# Patient Record
Sex: Female | Born: 2000 | Race: White | Hispanic: No | Marital: Single | Smoking: Never smoker
Health system: Southern US, Community
[De-identification: ages and names within clinical notes are randomized; demographics above are authoritative.]

---
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
## 2020-12-21 ENCOUNTER — Encounter (HOSPITAL_BASED_OUTPATIENT_CLINIC_OR_DEPARTMENT_OTHER): Payer: Self-pay | Admitting: Emergency Medicine

## 2020-12-21 ENCOUNTER — Other Ambulatory Visit: Payer: Self-pay

## 2020-12-21 ENCOUNTER — Emergency Department (HOSPITAL_BASED_OUTPATIENT_CLINIC_OR_DEPARTMENT_OTHER)
Admission: EM | Admit: 2020-12-21 | Discharge: 2020-12-22 | Disposition: A | Discharge status: Home / Self Care | Payer: BC Managed Care – PPO | Attending: Emergency Medicine | Admitting: Emergency Medicine

## 2020-12-21 DIAGNOSIS — R7401 Elevation of levels of liver transaminase levels: Secondary | ICD-10-CM | POA: Insufficient documentation

## 2020-12-21 DIAGNOSIS — R748 Abnormal levels of other serum enzymes: Secondary | ICD-10-CM

## 2020-12-21 DIAGNOSIS — R109 Unspecified abdominal pain: Secondary | ICD-10-CM | POA: Insufficient documentation

## 2020-12-21 DIAGNOSIS — R188 Other ascites: Secondary | ICD-10-CM | POA: Insufficient documentation

## 2020-12-21 DIAGNOSIS — K7689 Other specified diseases of liver: Secondary | ICD-10-CM

## 2020-12-21 NOTE — ED Triage Notes (Signed)
Patient arrived via POV c/o abdominal pain x 2 days. Patient states pain starting while in hospital for tonsillitis. Patient states abdominal pain in RUQ/LUQ. Patient states 5/10 pain. Patient is AO x 4, VS w/ elevated HR, normal gait.

## 2020-12-22 ENCOUNTER — Emergency Department (HOSPITAL_BASED_OUTPATIENT_CLINIC_OR_DEPARTMENT_OTHER): Payer: BC Managed Care – PPO

## 2020-12-22 LAB — CBC WITH DIFFERENTIAL/PLATELET
Abs Immature Granulocytes: 0.11 10*3/uL — ABNORMAL HIGH (ref 0.00–0.07)
Basophils Absolute: 0 10*3/uL (ref 0.0–0.1)
Basophils Relative: 0 %
Eosinophils Absolute: 0 10*3/uL (ref 0.0–0.5)
Eosinophils Relative: 0 %
HCT: 33 % — ABNORMAL LOW (ref 36.0–46.0)
Hemoglobin: 10.9 g/dL — ABNORMAL LOW (ref 12.0–15.0)
Immature Granulocytes: 1 %
Lymphocytes Relative: 25 %
Lymphs Abs: 4.3 10*3/uL — ABNORMAL HIGH (ref 0.7–4.0)
MCH: 27.2 pg (ref 26.0–34.0)
MCHC: 33 g/dL (ref 30.0–36.0)
MCV: 82.3 fL (ref 80.0–100.0)
Monocytes Absolute: 1.8 10*3/uL — ABNORMAL HIGH (ref 0.1–1.0)
Monocytes Relative: 10 %
Neutro Abs: 11.1 10*3/uL — ABNORMAL HIGH (ref 1.7–7.7)
Neutrophils Relative %: 64 %
Platelets: 363 10*3/uL (ref 150–400)
RBC: 4.01 MIL/uL (ref 3.87–5.11)
RDW: 16.2 % — ABNORMAL HIGH (ref 11.5–15.5)
WBC: 17.4 10*3/uL — ABNORMAL HIGH (ref 4.0–10.5)
nRBC: 0 % (ref 0.0–0.2)

## 2020-12-22 LAB — COMPREHENSIVE METABOLIC PANEL
ALT: 116 U/L — ABNORMAL HIGH (ref 0–44)
AST: 61 U/L — ABNORMAL HIGH (ref 15–41)
Albumin: 3 g/dL — ABNORMAL LOW (ref 3.5–5.0)
Alkaline Phosphatase: 85 U/L (ref 38–126)
Anion gap: 8 (ref 5–15)
BUN: 11 mg/dL (ref 6–20)
CO2: 24 mmol/L (ref 22–32)
Calcium: 7.9 mg/dL — ABNORMAL LOW (ref 8.9–10.3)
Chloride: 104 mmol/L (ref 98–111)
Creatinine, Ser: 0.62 mg/dL (ref 0.44–1.00)
GFR, Estimated: >60 mL/min (ref >60)
Glucose, Bld: 98 mg/dL (ref 70–99)
Potassium: 3.3 mmol/L — ABNORMAL LOW (ref 3.5–5.1)
Sodium: 136 mmol/L (ref 135–145)
Total Bilirubin: 0.4 mg/dL (ref 0.3–1.2)
Total Protein: 6.5 g/dL (ref 6.5–8.1)

## 2020-12-22 LAB — URINALYSIS, ROUTINE W REFLEX MICROSCOPIC
Bilirubin Urine: NEGATIVE
Glucose, UA: NEGATIVE mg/dL
Hgb urine dipstick: NEGATIVE
Ketones, ur: NEGATIVE mg/dL
Leukocytes,Ua: NEGATIVE
Nitrite: NEGATIVE
Protein, ur: NEGATIVE mg/dL
Specific Gravity, Urine: 1.02 (ref 1.005–1.030)
pH: 7.5 (ref 5.0–8.0)

## 2020-12-22 LAB — HCG, QUANTITATIVE, PREGNANCY: hCG, Beta Chain, Quant, S: <1 m[IU]/mL (ref <5)

## 2020-12-22 LAB — LIPASE, BLOOD: Lipase: 22 U/L (ref 11–51)

## 2020-12-22 MED ORDER — LACTATED RINGERS IV BOLUS
1000.0000 mL | Freq: Once | INTRAVENOUS | Status: AC
  Administered 2020-12-22: 1000 mL via INTRAVENOUS

## 2020-12-22 MED ORDER — ACETAMINOPHEN 500 MG PO TABS
1000.0000 mg | ORAL_TABLET | Freq: Once | ORAL | Status: AC
  Administered 2020-12-22: 1000 mg via ORAL
  Filled 2020-12-22: qty 2

## 2020-12-22 MED ORDER — IOHEXOL 300 MG/ML  SOLN
100.0000 mL | Freq: Once | INTRAMUSCULAR | Status: AC | PRN
  Administered 2020-12-22: 100 mL via INTRAVENOUS

## 2020-12-22 NOTE — ED Notes (Signed)
Patient transported to CT 

## 2020-12-22 NOTE — ED Notes (Signed)
ED Provider at bedside. 

## 2020-12-22 NOTE — ED Provider Notes (Signed)
[facility] EMERGENCY DEPARTMENT Provider Note   CSN: [id number] Arrival date & time: 12/21/20  2328     History Chief Complaint  Patient presents with  . Abdominal Pain    Katherine Kelley is a 20 y.o. female.  Was apparent admitted to the hospital couple days ago for tonsillitis.  Started on Augmentin.  She is been having some abdominal pain since that time.  She states initially that it started after she was admitted to the hospital but then later states that it started before she even started antibiotics.  Patient states that all over.  No nausea or vomiting.  No diarrhea or constipation.  Has not migrated.  No urinary symptoms.  No vaginal symptoms.  No rashes.  No other associated symptoms.  No trauma   Abdominal Pain      History reviewed. No pertinent past medical history.  There are no problems to display for this patient.   History reviewed. No pertinent surgical history.   OB History   No obstetric history on file.     No family history on file.  Social History   Tobacco Use  . Smoking status: Never Smoker  . Smokeless tobacco: Never Used  Vaping Use  . Vaping Use: Never used  Substance Use Topics  . Alcohol use: No  . Drug use: No    Home Medications Prior to Admission medications   Medication Sig Start Date End Date Taking? Authorizing Provider  amoxicillin (AMOXIL) 500 MG capsule Take 2 capsules (1,000 mg total) by mouth 3 (three) times daily. 09/25/15   Molpus, John, MD  amoxicillin-clavulanate (AUGMENTIN) 875-125 MG tablet Take 1 tablet by mouth 2 (two) times daily. 12/20/20   [provider]    Allergies    Patient has no known allergies.  Review of Systems   Review of Systems  Gastrointestinal: Positive for abdominal pain.  All other systems reviewed and are negative.   Physical Exam Updated Vital Signs BP 97/69 (BP Location: Left Arm)   Pulse 77   Temp 98.5 F (36.9 C) (Oral)   Resp 14   Ht 5\' 3"  (1.6 m)   Wt  65.8 kg   LMP 12/14/2020 (Approximate)   SpO2 100%   BMI 25.69 kg/m   Physical Exam Vitals and nursing note reviewed.  Constitutional:      Appearance: She is well-developed.  HENT:     Head: Normocephalic and atraumatic.     Mouth/Throat:     Mouth: Mucous membranes are moist.     Pharynx: Oropharynx is clear.  Eyes:     Conjunctiva/sclera: Conjunctivae normal.     Pupils: Pupils are equal, round, and reactive to light.  Cardiovascular:     Rate and Rhythm: Normal rate and regular rhythm.  Pulmonary:     Effort: No respiratory distress.     Breath sounds: No stridor.  Abdominal:     General: Bowel sounds are normal. There is no distension.     Palpations: Abdomen is soft.     Tenderness: There is no abdominal tenderness. There is no right CVA tenderness or left CVA tenderness.  Musculoskeletal:        General: No swelling or tenderness. Normal range of motion.     Cervical back: Normal range of motion.  Skin:    General: Skin is warm and dry.  Neurological:     General: No focal deficit present.     Mental Status: She is alert.     ED  Results / Procedures / Treatments   Labs (all labs ordered are listed, but only abnormal results are displayed) Labs Reviewed  CBC WITH DIFFERENTIAL/PLATELET - Abnormal; Notable for the following components:      Result Value   WBC 17.4 (*)    Hemoglobin 10.9 (*)    HCT 33.0 (*)    RDW 16.2 (*)    Neutro Abs 11.1 (*)    Lymphs Abs 4.3 (*)    Monocytes Absolute 1.8 (*)    Abs Immature Granulocytes 0.11 (*)    All other components within normal limits  COMPREHENSIVE METABOLIC PANEL - Abnormal; Notable for the following components:   Potassium 3.3 (*)    Calcium 7.9 (*)    Albumin 3.0 (*)    AST 61 (*)    ALT 116 (*)    All other components within normal limits  URINE CULTURE  URINALYSIS, ROUTINE W REFLEX MICROSCOPIC  HCG, QUANTITATIVE, PREGNANCY  LIPASE, BLOOD  HEPATITIS PANEL, ACUTE    EKG None  Radiology CT ABDOMEN  PELVIS W CONTRAST  Result Date: 12/22/2020 CLINICAL DATA:  Abdominal pain for 2 days EXAM: CT ABDOMEN AND PELVIS WITH CONTRAST TECHNIQUE: Multidetector CT imaging of the abdomen and pelvis was performed using the standard protocol following bolus administration of intravenous contrast. CONTRAST:  OMNIPAQUE IOHEXOL 300 MG/ML  SOLN COMPARISON:  None. FINDINGS: Lower chest: Small right-sided pleural effusion is noted with mild basilar atelectasis. Hepatobiliary: Liver is within normal limits. Gallbladder is well distended with wall thickening. Pancreas: Unremarkable. No pancreatic ductal dilatation or surrounding inflammatory changes. Spleen: Normal in size without focal abnormality. Adrenals/Urinary Tract: Adrenal glands are within normal limits. Kidneys demonstrate no renal calculi or obstructive changes. Some patchy decreased enhancement is noted within the posterior aspect of the right kidney. This may represent focal pyelonephritis. No obstructive changes are seen. The bladder is decompressed. Stomach/Bowel: The appendix is within normal limits. Stomach and small bowel are within normal limits. Colon shows no obstructive or inflammatory changes. Vascular/Lymphatic: No significant vascular findings are present. No enlarged abdominal or pelvic lymph nodes. Reproductive: Uterus is within normal limits. Small simple function cysts are noted within the left ovary. Other: Mild free fluid is noted likely physiologic in nature. Musculoskeletal: No acute or significant osseous findings. IMPRESSION: Small right pleural effusion with mild right basilar atelectasis. Well distended gallbladder with gallbladder wall thickening. These changes are suspicious for acute cholecystitis. Ultrasound would be helpful for further evaluation. Patchy decreased enhancement in the right kidney suspicious for pyelonephritis. Small simple cyst within the left ovary. No follow-up imaging recommended. Note: This recommendation does not  apply to premenarchal patients and to those with increased risk (genetic, family history, elevated tumor markers or other high-risk factors) of ovarian cancer. Reference: JACR 2020 Feb; 17(2):248-254 Electronically Signed   By: Alcide Clever M.D.   On: 12/22/2020 01:54   US Abdomen Limited RUQ (LIVER/GB)  Result Date: 12/22/2020 CLINICAL DATA:  Right upper quadrant abdominal pain EXAM: ULTRASOUND ABDOMEN LIMITED RIGHT UPPER QUADRANT COMPARISON:  None. FINDINGS: Gallbladder: The gallbladder is decompressed and no intraluminal stones or sludge is identified. Moderate gallbladder wall thickening is noted, likely related to the decompressed nature of the gallbladder. There is, however, mild pericholecystic fluid identified, nonspecific. This can be seen simply as result of mild ascites or be seen in the setting of inflammatory conditions of the liver or biliary tree. The sonographic Eulah Pont sign is reportedly negative. Common bile duct: Diameter: 3 mm in proximal diameter Liver: No focal  lesion identified. Within normal limits in parenchymal echogenicity. Portal vein is patent on color Doppler imaging with normal direction of blood flow towards the liver. Other: Small right pleural effusion noted. Trace perihepatic ascites present. IMPRESSION: No sonographic evidence of acute cholecystitis. Mild pericholecystic fluid is nonspecific and may simply reflect changes of anasarca given the presence of small perihepatic ascites and pleural effusion. Electronically Signed   By: Helyn Numbers MD   On: 12/22/2020 02:44    Procedures Procedures   Medications Ordered in ED Medications  acetaminophen (TYLENOL) tablet 1,000 mg (1,000 mg Oral Given 12/22/20 0010)  lactated ringers bolus 1,000 mL (0 mLs Intravenous Stopped 12/22/20 0235)  iohexol (OMNIPAQUE) 300 MG/ML solution 100 mL (100 mLs Intravenous Contrast Given 12/22/20 0126)    ED Course  I have reviewed the triage vital signs and the nursing notes.  Pertinent  labs & imaging results that were available during my care of the patient were reviewed by me and considered in my medical decision making (see chart for details).    MDM Rules/Calculators/A&P                          Suspect her symptoms are probably from the tonsillitis and postnasal drip along with Augmentin however her liver enzymes are little bit elevated so CT scan was done.  This showed that she did have concern for possible cholecystitis but ultrasound ruled that out.  She does have some perihepatic fluid collection associated the right pleural effusion.  This is likely reactive in the setting of tonsillitis.  She is not in acute hepatic failure.  She does not have right upper quadrant tenderness to palpation.  At this time we will plan for watchful waiting and follow-up with PCP/GI to recheck her liver labs in a couple weeks after start antibiotics  Final Clinical Impression(s) / ED Diagnoses Final diagnoses:  Abdominal pain  Elevated liver enzymes  Perihepatic fluid collection    Rx / DC Orders ED Discharge Orders         Ordered    Ambulatory referral to Gastroenterology        12/22/20 0314           Jatoya Armbrister, Barbara Cower, MD 12/22/20 [phone]

## 2020-12-23 LAB — URINE CULTURE: Culture: NO GROWTH

## 2021-07-19 IMAGING — CT CT ABD-PELV W/ CM
2 of 4 series · 15 of 46 positions shown, 17 images · IV contrast (Omnipaque)
Comparison: None.

CLINICAL DATA: Abdominal pain for 2 days

EXAM:
CT ABDOMEN AND PELVIS WITH CONTRAST
TECHNIQUE: Multidetector CT imaging of the abdomen and pelvis was performed
using the standard protocol following bolus administration of
intravenous contrast.
CONTRAST:  100mL OMNIPAQUE IOHEXOL 300 MG/ML  SOLN

[Series 2: axial st · axial · 0.96mm/px · z∈[-421,-36]mm · 12 of 91 slices shown, 14 images]
[im 7/91  soft-tissue]
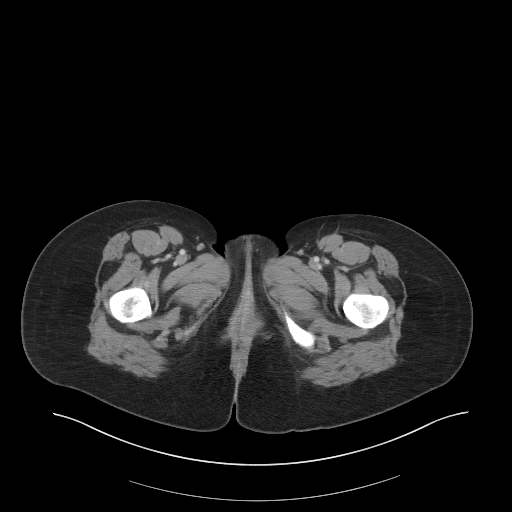
[im 7/91  bone]
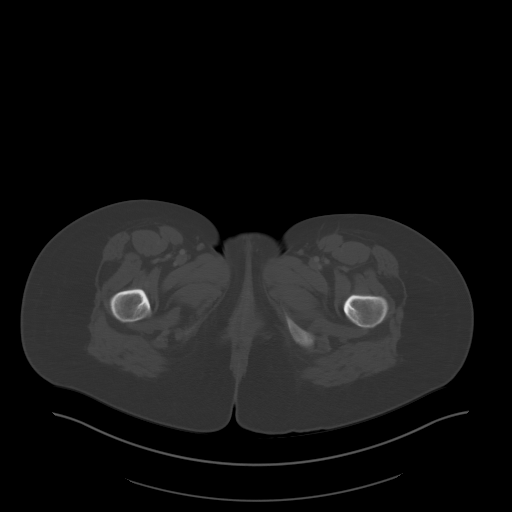
[im 14/91  soft-tissue]
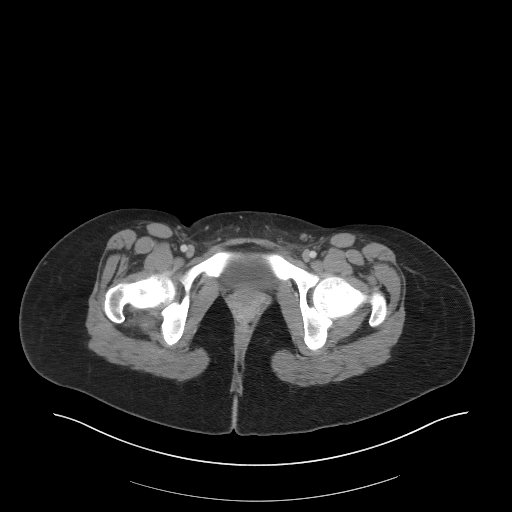
[im 21/91  soft-tissue]
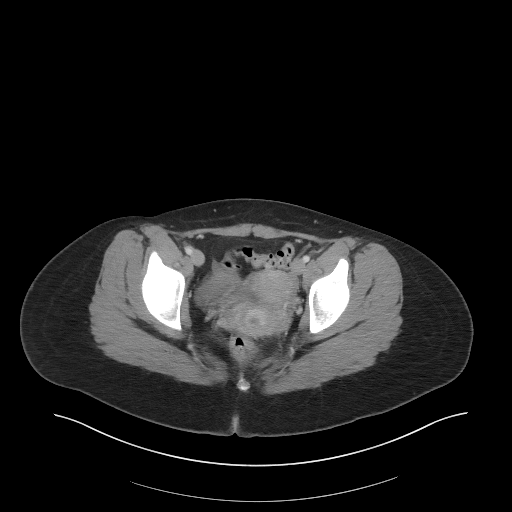
[im 28/91  soft-tissue]
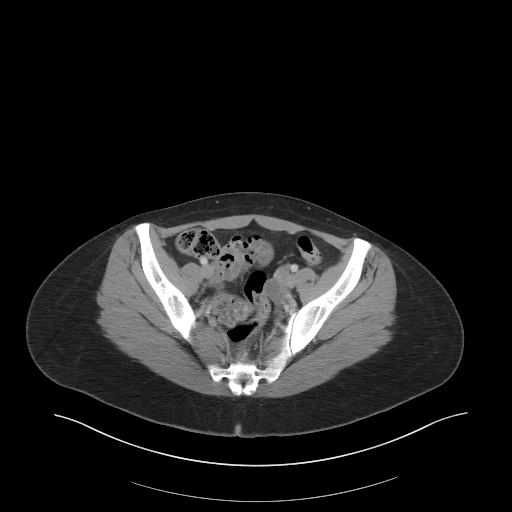
[im 35/91  soft-tissue]
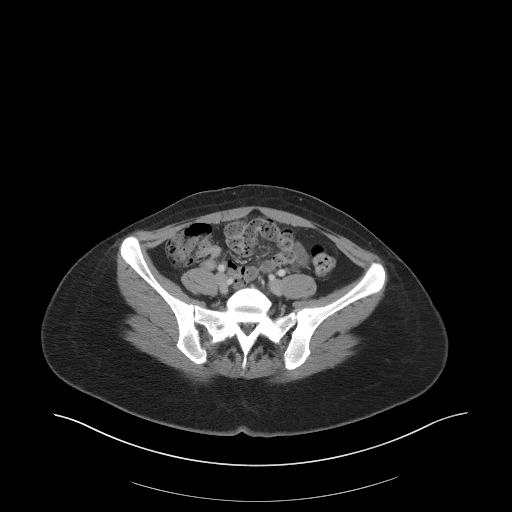
[im 42/91  soft-tissue]
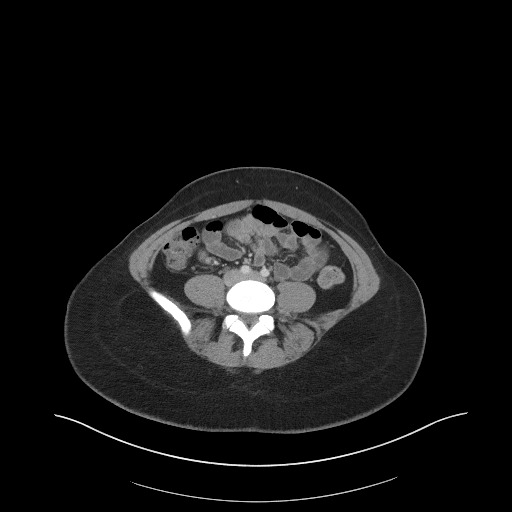
[im 49/91  soft-tissue]
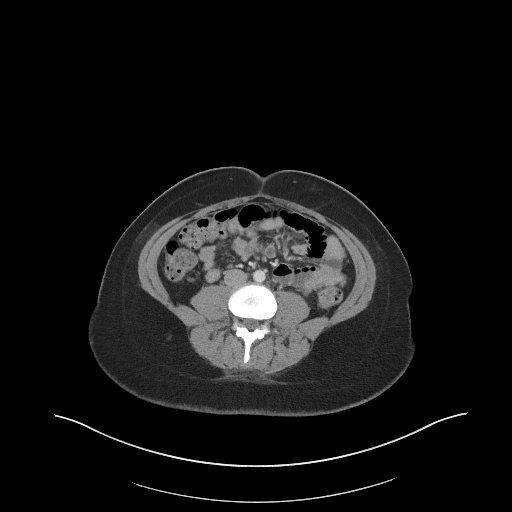
[im 56/91  soft-tissue]
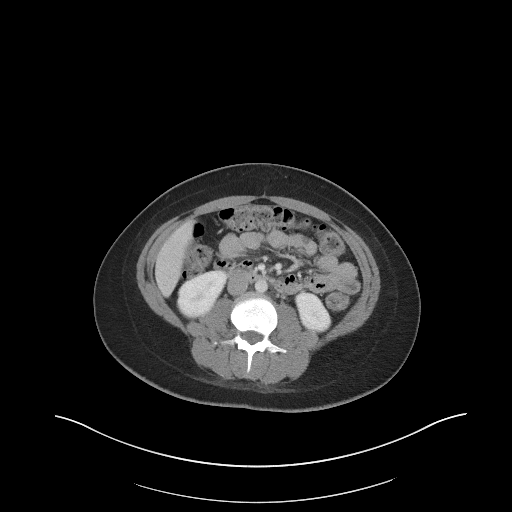
[im 63/91  soft-tissue]
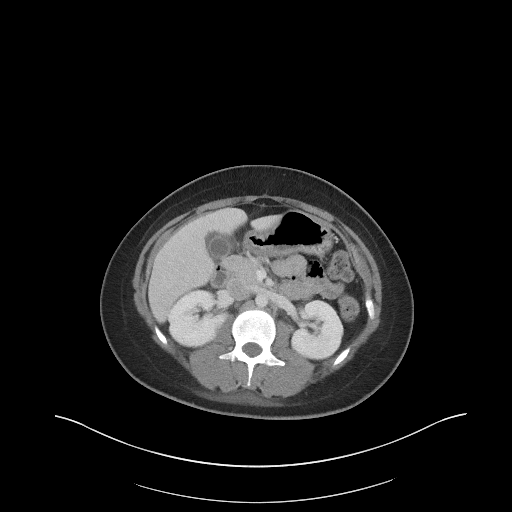
[im 63/91  bone]
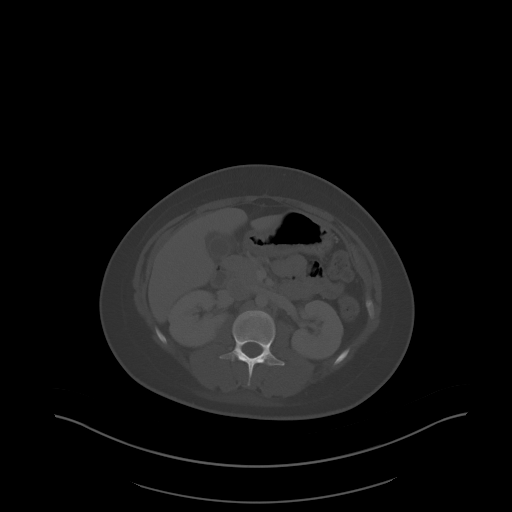
[im 70/91  soft-tissue]
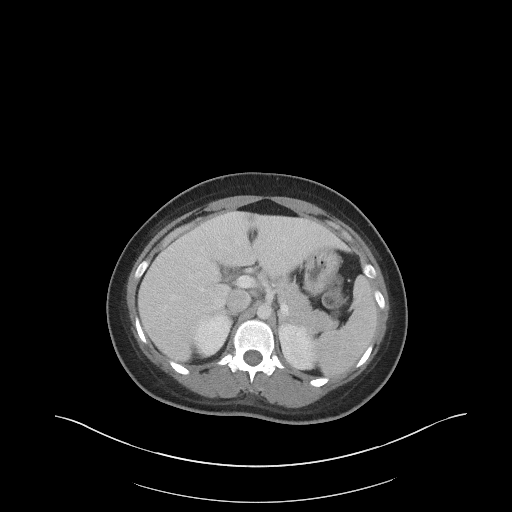
[im 77/91  soft-tissue]
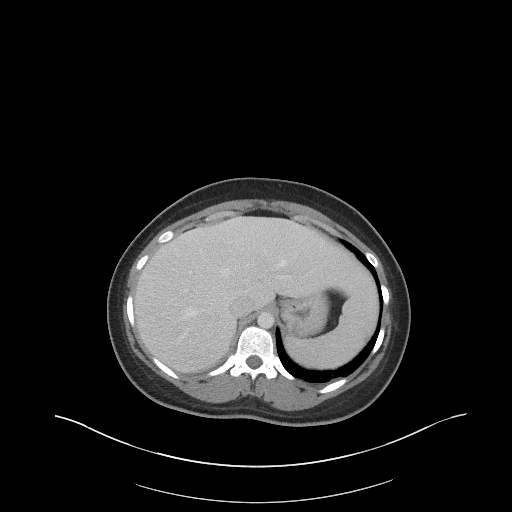
[im 84/91  soft-tissue]
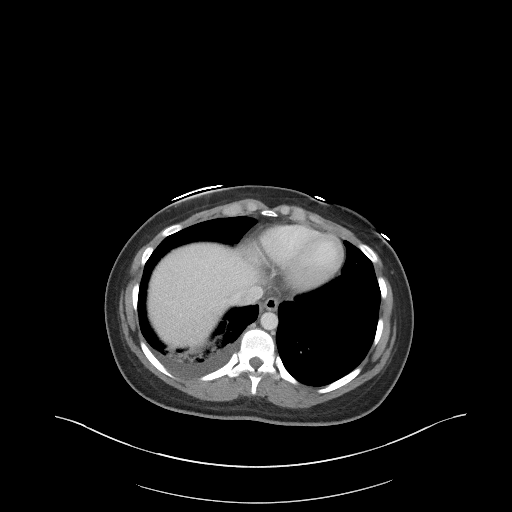

[Series 5: coronal st · coronal · 0.66mm/px · 3 of 79 slices shown]
[im 27/79  soft-tissue]
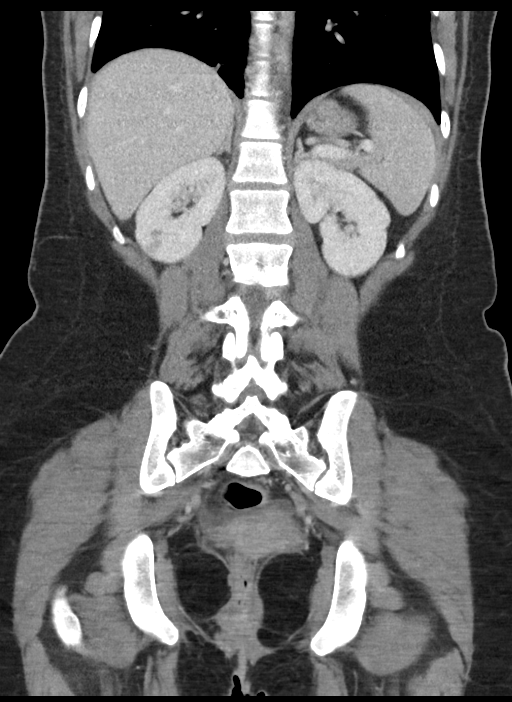
[im 35/79  soft-tissue]
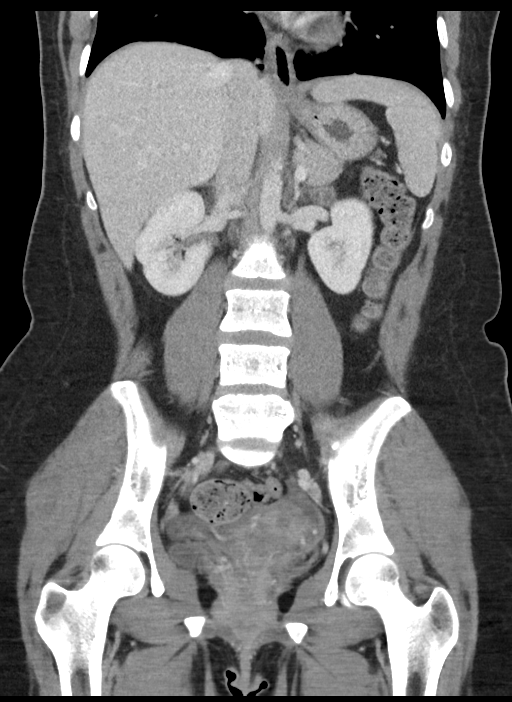
[im 44/79  soft-tissue]
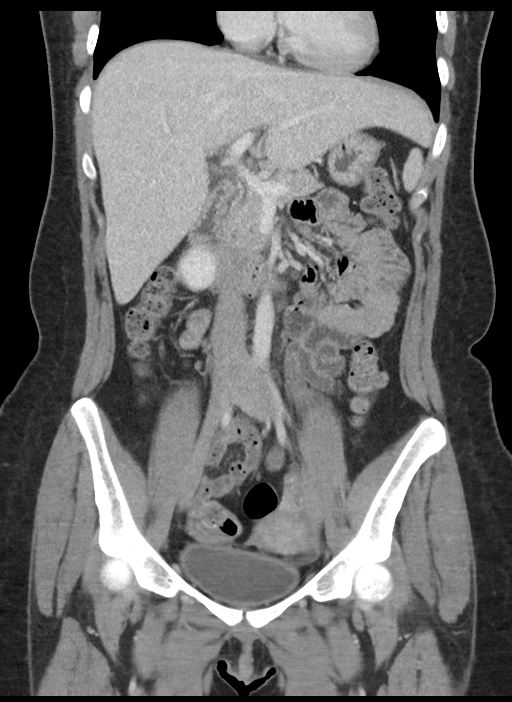

[15 of 46 positions shown; findings below may reference images not displayed]

FINDINGS: Lower chest: Small right-sided pleural effusion is noted with mild
basilar atelectasis.

Hepatobiliary: Liver is within normal limits. Gallbladder is well
distended with wall thickening.

Pancreas: Unremarkable. No pancreatic ductal dilatation or
surrounding inflammatory changes.

Spleen: Normal in size without focal abnormality.

Adrenals/Urinary Tract: Adrenal glands are within normal limits.
Kidneys demonstrate no renal calculi or obstructive changes. Some
patchy decreased enhancement is noted within the posterior aspect of
the right kidney. This may represent focal pyelonephritis. No
obstructive changes are seen. The bladder is decompressed.

Stomach/Bowel: The appendix is within normal limits. Stomach and
small bowel are within normal limits. Colon shows no obstructive or
inflammatory changes.

Vascular/Lymphatic: No significant vascular findings are present. No
enlarged abdominal or pelvic lymph nodes.

Reproductive: Uterus is within normal limits. Small simple function
cysts are noted within the left ovary.

Other: Mild free fluid is noted likely physiologic in nature.

Musculoskeletal: No acute or significant osseous findings.
IMPRESSION: Small right pleural effusion with mild right basilar atelectasis.

Well distended gallbladder with gallbladder wall thickening. These
changes are suspicious for acute cholecystitis. Ultrasound would be
helpful for further evaluation.

Patchy decreased enhancement in the right kidney suspicious for
pyelonephritis.

Small simple cyst within the left ovary. No follow-up imaging
recommended. Note: This recommendation does not apply to
premenarchal patients and to those with increased risk (genetic,
family history, elevated tumor markers or other high-risk factors)
of ovarian cancer. Reference: JACR [DATE]):248-254

## 2021-07-19 IMAGING — US US ABDOMEN LIMITED RUQ/ASCITES
1 series · 14 of 25 positions shown · non-contrast
Comparison: None.

CLINICAL DATA: Right upper quadrant abdominal pain

EXAM:
ULTRASOUND ABDOMEN LIMITED RIGHT UPPER QUADRANT

[Series 1: us abdomen limited ruq/ascites · 14 of 50 slices shown]
[im 1/50]
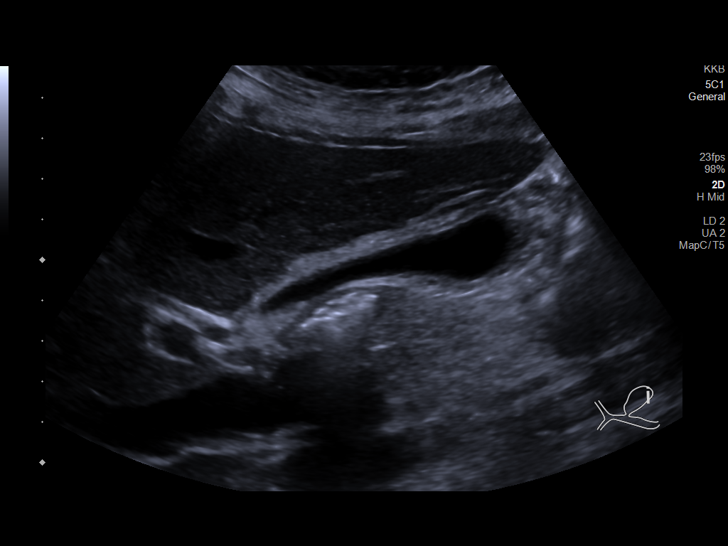
[im 5/50]
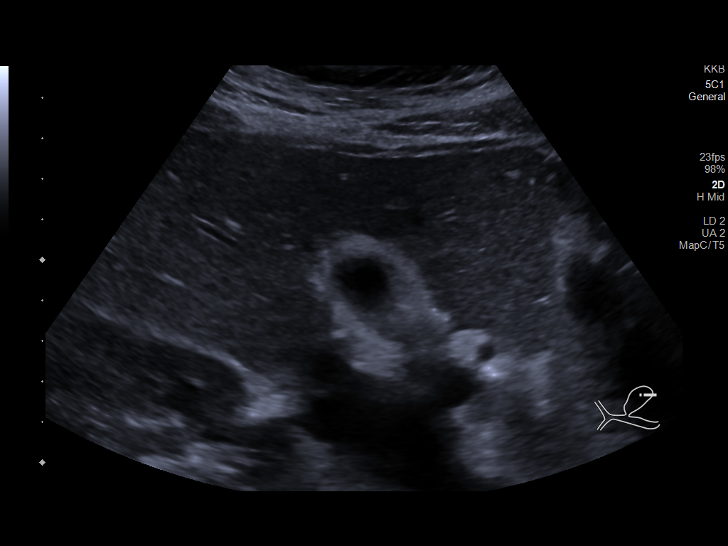
[im 9/50]
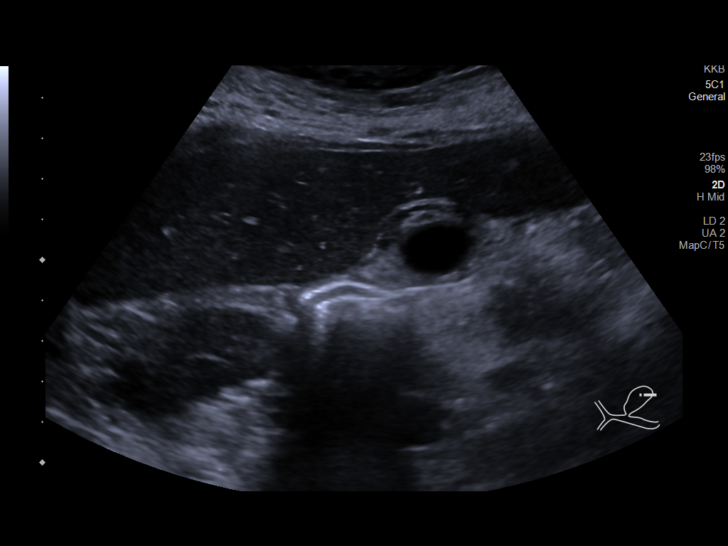
[im 13/50]
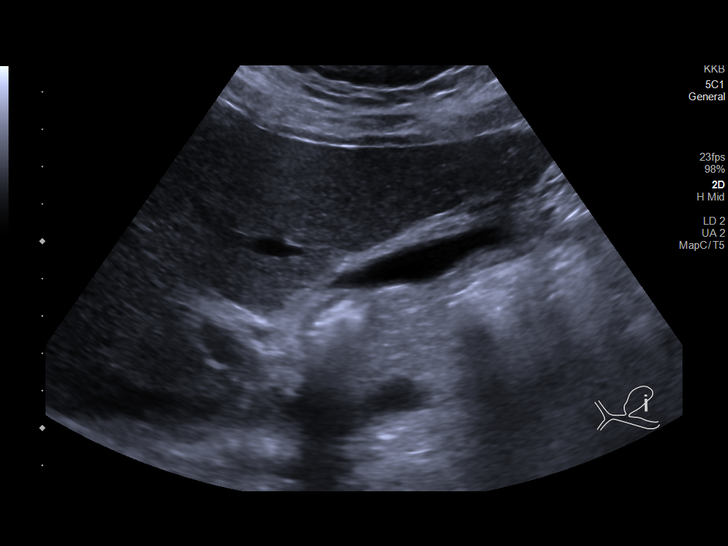
[im 17/50]
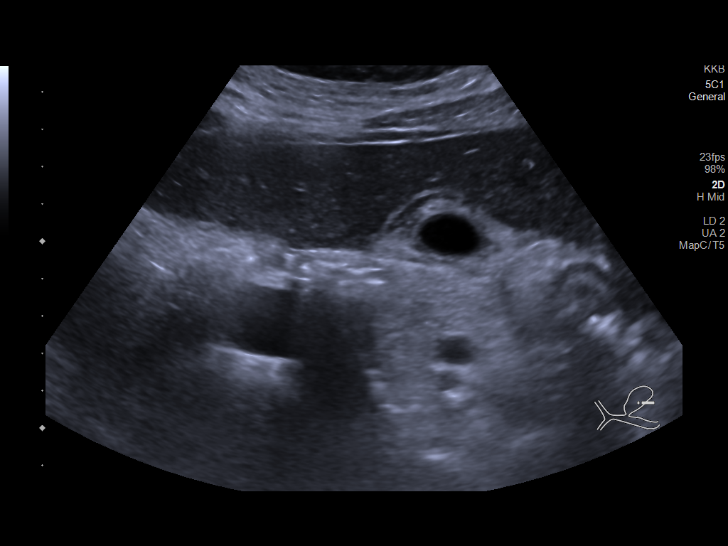
[im 19/50]
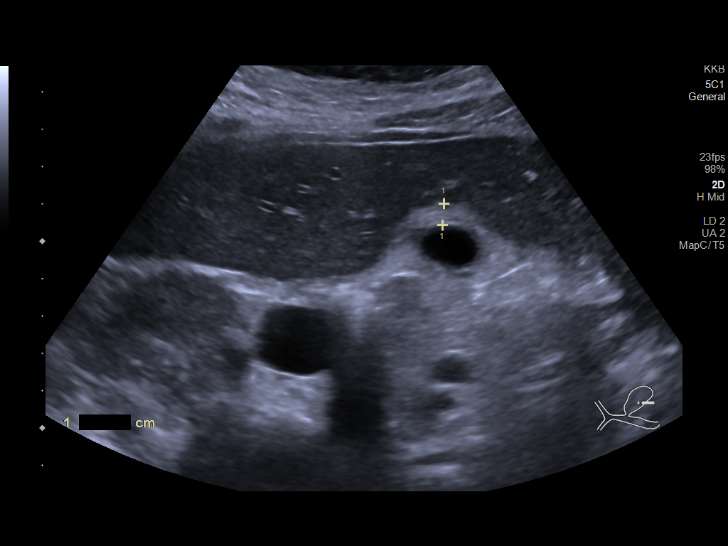
[im 23/50]
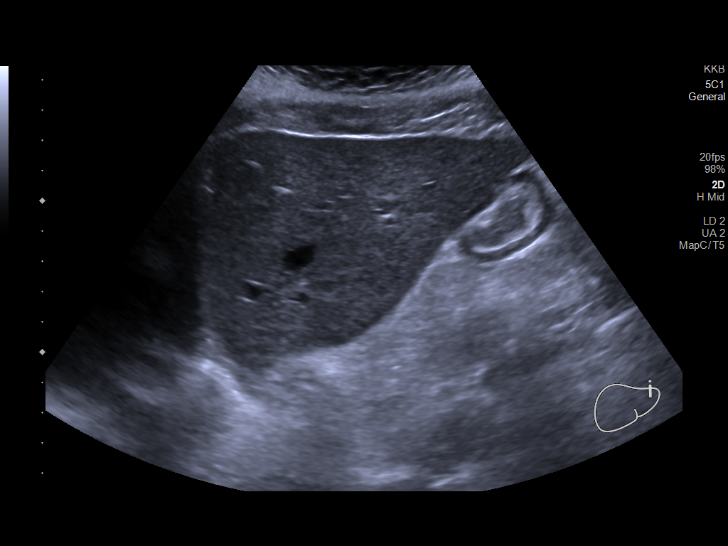
[im 27/50]
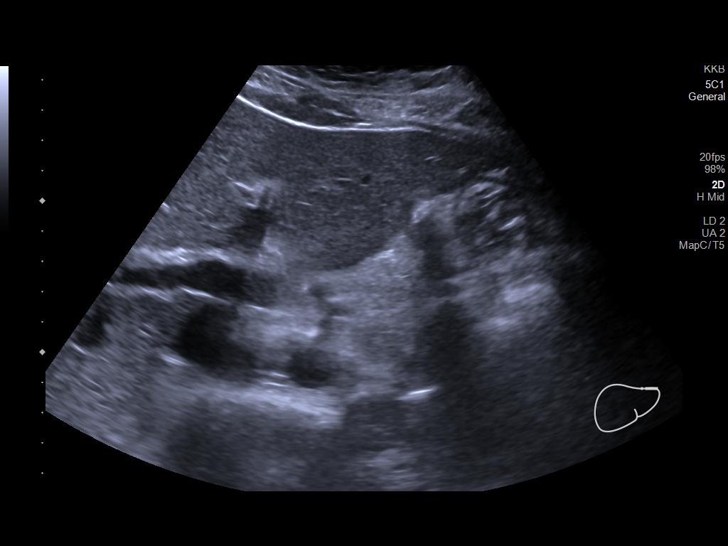
[im 31/50]
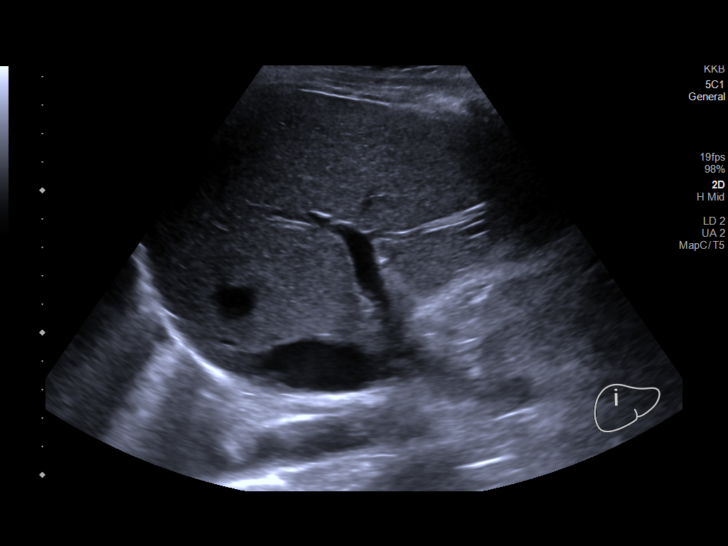
[im 33/50]
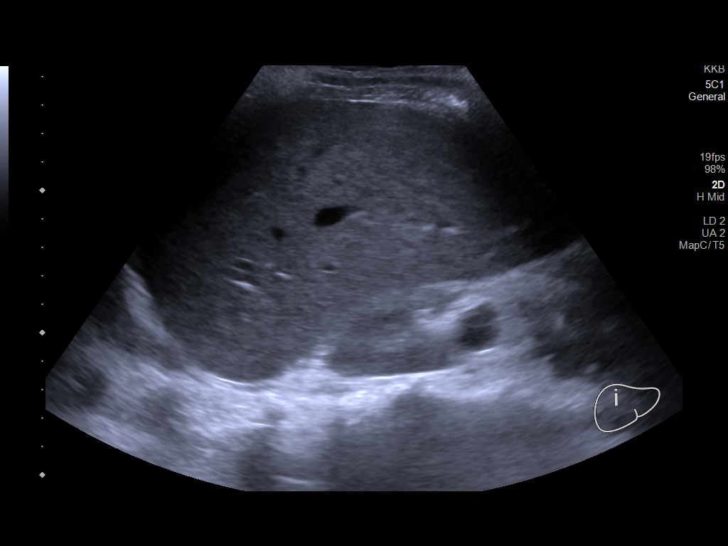
[im 37/50]
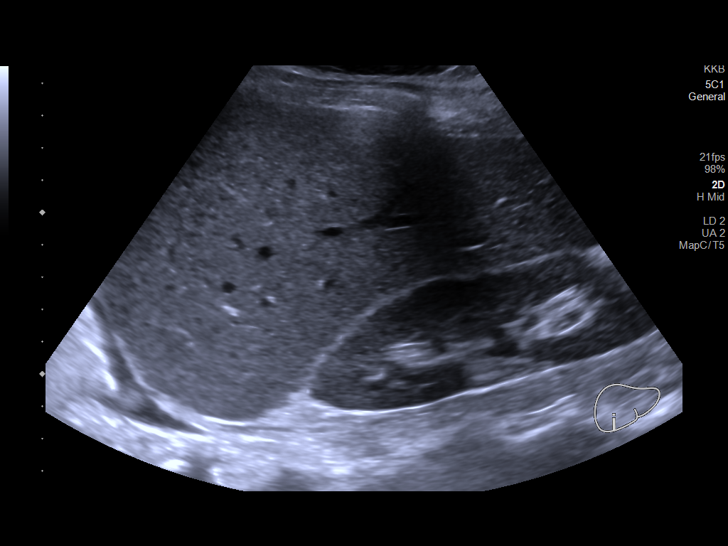
[im 41/50]
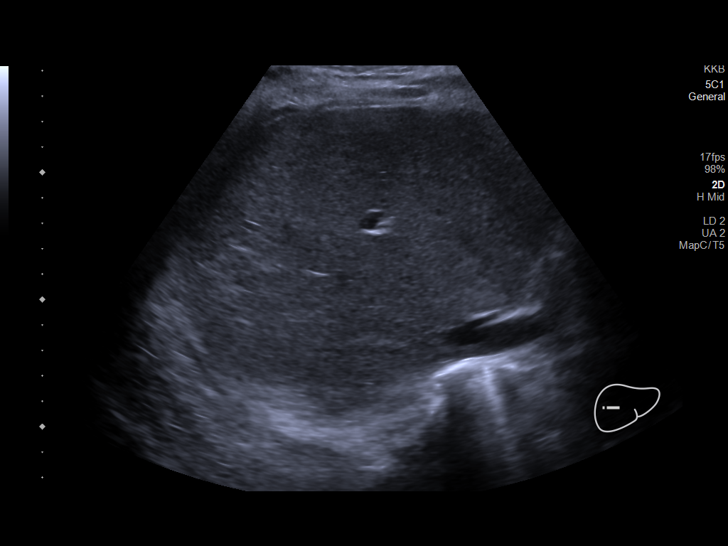
[im 45/50]
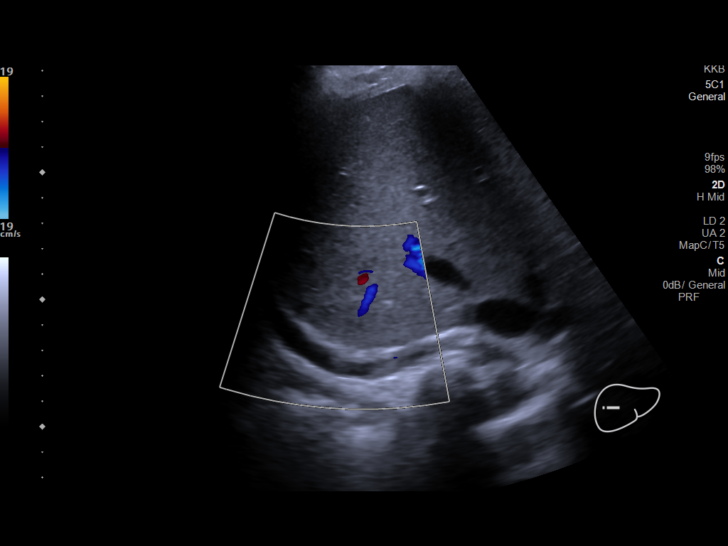
[im 50/50]
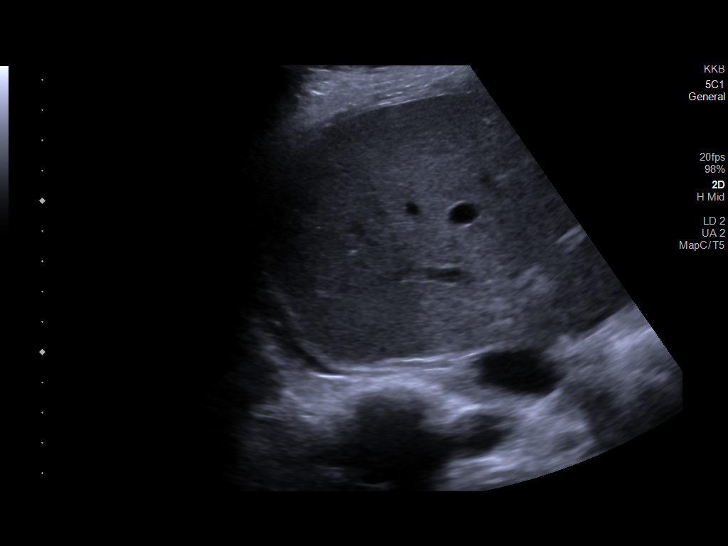

[14 of 25 positions shown; findings below may reference images not displayed]

FINDINGS: Gallbladder:

The gallbladder is decompressed and no intraluminal stones or sludge
is identified. Moderate gallbladder wall thickening is noted, likely
related to the decompressed nature of the gallbladder. There is,
however, mild pericholecystic fluid identified, nonspecific. This
can be seen simply as result of mild ascites or be seen in the
setting of inflammatory conditions of the liver or biliary tree. The
sonographic Murphy sign is reportedly negative.

Common bile duct:

Diameter: 3 mm in proximal diameter

Liver:

No focal lesion identified. Within normal limits in parenchymal
echogenicity. Portal vein is patent on color Doppler imaging with
normal direction of blood flow towards the liver.

Other: Small right pleural effusion noted. Trace perihepatic ascites
present.
IMPRESSION: No sonographic evidence of acute cholecystitis. Mild pericholecystic
fluid is nonspecific and may simply reflect changes of anasarca
given the presence of small perihepatic ascites and pleural
effusion.

## 2022-06-07 ENCOUNTER — Ambulatory Visit: Payer: Medicaid Other | Attending: Physician Assistant | Admitting: Physical Therapy

## 2022-06-07 ENCOUNTER — Encounter: Payer: Self-pay | Admitting: Physical Therapy

## 2022-06-07 DIAGNOSIS — M546 Pain in thoracic spine: Secondary | ICD-10-CM | POA: Diagnosis present

## 2022-06-07 DIAGNOSIS — R278 Other lack of coordination: Secondary | ICD-10-CM | POA: Diagnosis present

## 2022-06-07 DIAGNOSIS — R293 Abnormal posture: Secondary | ICD-10-CM | POA: Diagnosis present

## 2022-06-07 DIAGNOSIS — M6281 Muscle weakness (generalized): Secondary | ICD-10-CM
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
## 2022-06-13 NOTE — Therapy (Signed)
OUTPATIENT PHYSICAL THERAPY TREATMENT NOTE   Patient Name: Katherine Kelley MRN: [id number] DOB:[birth year], 21 y.o., female Today's Date: 06/14/2022   END OF SESSION:   PT End of Session - 06/14/22 0934     Visit Number 2    Date for PT Re-Evaluation 08/16/22    PT Start Time 0930    PT Stop Time 1010    PT Time Calculation (min) 40 min    Activity Tolerance Patient tolerated treatment well;No increased pain    Behavior During Therapy [facility] for tasks assessed/performed             History reviewed. No pertinent past medical history. History reviewed. No pertinent surgical history. There are no problems to display for this patient.      PCP: Monna Fam   REFERRING PROVIDER: Andria Frames   REFERRING DIAG: M54.6 (ICD-10-CM) - Pain in thoracic spine   Rationale for Evaluation and Treatment Rehabilitation   THERAPY DIAG:  Pain in thoracic spine   Abnormal posture   Muscle weakness (generalized)   Other lack of coordination   ONSET DATE: 05/24/22   SUBJECTIVE:                                                                                                                                                                                            SUBJECTIVE STATEMENT: Pt states things are going well. No pain currently.   **No Ionto, Vaso, E-stim, Traction**   PERTINENT HISTORY:  N/A   PAIN:  Are you having pain? Yes: NPRS scale: 0/10 Pain location: Upper back, B, no radiating pain. Pain description: ache Aggravating factors: Lack of movement Relieving factors: Naproxin     PRECAUTIONS: None   WEIGHT BEARING RESTRICTIONS No   FALLS:  Has patient fallen in last 6 months? No   LIVING ENVIRONMENT: Lives with: lives with an adult companion Lives in: House/apartment Stairs: Yes: External: 3 steps; bilateral but cannot reach both Has following equipment at home: None   OCCUPATION: Server   PLOF: Independent   PATIENT GOALS Pain relief to return  to her previous activities.     OBJECTIVE:    DIAGNOSTIC FINDINGS:  N/A   PATIENT SURVEYS:  FOTO 63   SCREENING FOR RED FLAGS: Bowel or bladder incontinence: No Spinal tumors: No Cauda equina syndrome: No Compression fracture: No Abdominal aneurysm: No   COGNITION:           Overall cognitive status: Within functional limits for tasks assessed  SENSATION: Not tested   POSTURE: decreased thoracic kyphosis and anterior pelvic tilt   PALPATION: Patient is very TTP along B up traps, LS, rhomboidsThoracic paraspinals to approx T7  No TTP in any shoulder, upper arm musculature.   LUMBAR ROM: WNL all planes CERVICAL ROM: Flexion, lateral flexion limited to 80% normal, with pain.     LOWER EXTREMITY ROM and STRENGTH:  WNL B             UPPER EXTREMITY ROM and STRENGTH: B shoulder elevation mildly limited, and all shoulder movements impeded by pain, elbows, wrist, hands WNL.   LUMBAR SPECIAL TESTS:  Slump test: Negative   FUNCTIONAL TESTS:  5 times sit to stand: <12 sec   GAIT: Distance walked: 22' Assistive device utilized: None Level of assistance: Complete Independence Comments: No noted gait deviations       TODAY'S TREATMENT   06/14/22 Open book both sides x10 reps Quadruped cat/cow x10 reps  Quadruped threading the needle x10 both sides Standing rows red TB 2x10 reps  Sidelying abduction #2 x10 reps Lt/Rt  Sidelying UE horizontal abduction #2 x10 reps Lt/Rt  Seated shoulder ER red TB 2x10 reps  Incline plank hands/feet shoulder taps x10 Pec stretch in doorway 2x20 sec hold  Supine shoulder diagonal yellow TB x10 reps  UBE x2' fwd/back    06/07/22 STM, initiate HEP, patient education     PATIENT EDUCATION:  Education details: HEP, POC Person educated: Patient Education method: Explanation, Demonstration, and Handouts Education comprehension: verbalized understanding and returned demonstration     HOME EXERCISE  PROGRAM: [id number]   ASSESSMENT:   CLINICAL IMPRESSION: Pt arrived without complaints of pain and is reportedly completing her HEP without issue. Session focused on increasing thoracic mobility and postural strength. Pt demonstrated proper technique with all exercises, requiring only initial cuing for set up. Pt denied pain end of session, and she demonstrated good understanding of HEP updates.    OBJECTIVE IMPAIRMENTS decreased coordination, decreased ROM, decreased strength, impaired flexibility, improper body mechanics, postural dysfunction, and pain.    ACTIVITY LIMITATIONS carrying, lifting, and reach over head   PARTICIPATION LIMITATIONS: cleaning and occupation   PERSONAL FACTORS Past/current experiences are also affecting patient's functional outcome.    REHAB POTENTIAL: Good   CLINICAL DECISION MAKING: Stable/uncomplicated   EVALUATION COMPLEXITY: Moderate     GOALS: Goals reviewed with patient? Yes   SHORT TERM GOALS: Target date: 06/21/2022   I with initial HEP Baseline: Goal status: INITIAL   LONG TERM GOALS: Target date: 08/16/2022   I with final HEP Baseline:  Goal status: INITIAL   2.  Increase FOTO score to at least 75 Baseline: 63 Goal status: INITIAL   3.  Decrease Owestry score to < 5 Baseline: 7 Goal status: INITIAL   4.  Patient will report max pain score of < 3/5 after completing a shift at work. Baseline:  Goal status: INITIAL PLAN: PT FREQUENCY: 1x/week   PT DURATION: 10 weeks   PLANNED INTERVENTIONS: Therapeutic exercises, Therapeutic activity, Neuromuscular re-education, Balance training, Gait training, Patient/Family education, Self Care, Joint mobilization, Dry Needling, Moist heat, and Manual therapy.   PLAN FOR NEXT SESSION: continue to update HEP; progress trunk stabilization and postural exercises   10:12 AM,06/14/22 Sherol Dade PT, DPT [facility] [place]

## 2022-06-14 ENCOUNTER — Encounter: Payer: Self-pay | Admitting: Physical Therapy

## 2022-06-14 ENCOUNTER — Ambulatory Visit: Payer: Medicaid Other | Admitting: Physical Therapy

## 2022-06-14 DIAGNOSIS — M546 Pain in thoracic spine: Secondary | ICD-10-CM | POA: Diagnosis not present

## 2022-06-14 DIAGNOSIS — R293 Abnormal posture: Secondary | ICD-10-CM

## 2022-06-14 DIAGNOSIS — R278 Other lack of coordination: Secondary | ICD-10-CM

## 2022-06-14 DIAGNOSIS — M6281 Muscle weakness (generalized): Secondary | ICD-10-CM

## 2022-06-20 ENCOUNTER — Ambulatory Visit: Payer: Medicaid Other | Admitting: Physical Therapy

## 2022-06-29 ENCOUNTER — Ambulatory Visit: Payer: Medicaid Other | Admitting: Physical Therapy

## 2022-07-06 ENCOUNTER — Ambulatory Visit: Payer: Medicaid Other | Attending: Physician Assistant | Admitting: Physical Therapy

## 2022-07-06 DIAGNOSIS — M6281 Muscle weakness (generalized): Secondary | ICD-10-CM | POA: Insufficient documentation

## 2022-07-06 DIAGNOSIS — M546 Pain in thoracic spine: Secondary | ICD-10-CM | POA: Insufficient documentation

## 2022-07-06 NOTE — Therapy (Signed)
OUTPATIENT PHYSICAL THERAPY TREATMENT NOTE   Patient Name: Katherine Kelley MRN: [id number] DOB:02-17-2001, 21 y.o., female Today's Date: 07/06/2022   END OF SESSION:   PT End of Session - 07/06/22 0926     Visit Number 3    Date for PT Re-Evaluation 08/16/22    PT Start Time 0925    PT Stop Time 1005    PT Time Calculation (min) 40 min             No past medical history on file. No past surgical history on file. There are no problems to display for this patient.      PCP: Monna Fam   REFERRING PROVIDER: Andria Frames   REFERRING DIAG: M54.6 (ICD-10-CM) - Pain in thoracic spine   Rationale for Evaluation and Treatment Rehabilitation   THERAPY DIAG:  Pain in thoracic spine   Abnormal posture   Muscle weakness (generalized)   Other lack of coordination   ONSET DATE: 05/24/22   SUBJECTIVE:                                                                                                                                                                                            SUBJECTIVE STATEMENT: Pain is much better overall 70%   **No Ionto, Vaso, E-stim, Traction**   PERTINENT HISTORY:  N/A   PAIN:  Are you having pain? Yes: NPRS scale: 3/10 Pain location: Upper back, B, no radiating pain. Pain description: ache Aggravating factors: Lack of movement Relieving factors: Naproxin     PRECAUTIONS: None   WEIGHT BEARING RESTRICTIONS No   FALLS:  Has patient fallen in last 6 months? No   LIVING ENVIRONMENT: Lives with: lives with an adult companion Lives in: House/apartment Stairs: Yes: External: 3 steps; bilateral but cannot reach both Has following equipment at home: None   OCCUPATION: Server   PLOF: Independent   PATIENT GOALS Pain relief to return to her previous activities.     OBJECTIVE:    DIAGNOSTIC FINDINGS:  N/A   PATIENT SURVEYS:  FOTO 63   SCREENING FOR RED FLAGS: Bowel or bladder incontinence: No Spinal tumors:  No Cauda equina syndrome: No Compression fracture: No Abdominal aneurysm: No   COGNITION:           Overall cognitive status: Within functional limits for tasks assessed                          SENSATION: Not tested   POSTURE: decreased thoracic kyphosis and anterior pelvic tilt   PALPATION: Patient is very TTP along B up traps, LS,  rhomboidsThoracic paraspinals to approx T7  No TTP in any shoulder, upper arm musculature.   LUMBAR ROM: WNL all planes CERVICAL ROM: Flexion, lateral flexion limited to 80% normal, with pain.     LOWER EXTREMITY ROM and STRENGTH:  WNL B             UPPER EXTREMITY ROM and STRENGTH: B shoulder elevation mildly limited, and all shoulder movements impeded by pain, elbows, wrist, hands WNL.   LUMBAR SPECIAL TESTS:  Slump test: Negative   FUNCTIONAL TESTS:  5 times sit to stand: <12 sec   GAIT: Distance walked: 65' Assistive device utilized: None Level of assistance: Complete Independence Comments: No noted gait deviations       TODAY'S TREATMENT   07/06/22 Nustep L 5 3 min  UBE L2 2 min fwd/2 min backward Lat pull and row 25# 2 sets 10 Black tband trunk ext 20x Red tband 3 pt stab 10 x each BIL Wt ball OH ext and trunk rotation 15 x each 2# 4 pt rhy stab on wall 10 x each BIL Red tband diagonals 20 x Red tband ER 2 sets 10  06/14/22 Open book both sides x10 reps Quadruped cat/cow x10 reps  Quadruped threading the needle x10 both sides Standing rows red TB 2x10 reps  Sidelying abduction #2 x10 reps Lt/Rt  Sidelying UE horizontal abduction #2 x10 reps Lt/Rt  Seated shoulder ER red TB 2x10 reps  Incline plank hands/feet shoulder taps x10 Pec stretch in doorway 2x20 sec hold  Supine shoulder diagonal yellow TB x10 reps  UBE x2' fwd/back    06/07/22 STM, initiate HEP, patient education     PATIENT EDUCATION:  Education details: HEP, POC Person educated: Patient Education method: Consulting civil engineer, Demonstration, and  Handouts Education comprehension: verbalized understanding and returned demonstration     HOME EXERCISE PROGRAM: [id number]   ASSESSMENT:   CLINICAL IMPRESSION: Pt arrived without complaints minimal pain 3/10 but overall better and is reportedly completing her HEP without issue. Continued Session focused on increasing thoracic mobility and postural strength. Pt demonstrated proper technique with all exercises, requiring only initial cuing for set up. Progressing with goals as documented. OBJECTIVE IMPAIRMENTS decreased coordination, decreased ROM, decreased strength, impaired flexibility, improper body mechanics, postural dysfunction, and pain.    ACTIVITY LIMITATIONS carrying, lifting, and reach over head   PARTICIPATION LIMITATIONS: cleaning and occupation   PERSONAL FACTORS Past/current experiences are also affecting patient's functional outcome.    REHAB POTENTIAL: Good   CLINICAL DECISION MAKING: Stable/uncomplicated   EVALUATION COMPLEXITY: Moderate     GOALS: Goals reviewed with patient? Yes   SHORT TERM GOALS: Target date: 06/21/2022   I with initial HEP Baseline: Goal status: met 07/06/22   LONG TERM GOALS: Target date: 08/16/2022   I with final HEP Baseline:  Goal status: ongoing 07/06/22   2.  Increase FOTO score to at least 75 Baseline: 63 Goal status: INITIAL   3.  Decrease Owestry score to < 5 Baseline: 7 Goal status: INITIAL   4.  Patient will report max pain score of < 3/5 after completing a shift at work. Baseline:  Goal status: progressing 07/06/22 PLAN: PT FREQUENCY: 1x/week   PT DURATION: 10 weeks   PLANNED INTERVENTIONS: Therapeutic exercises, Therapeutic activity, Neuromuscular re-education, Balance training, Gait training, Patient/Family education, Self Care, Joint mobilization, Dry Needling, Moist heat, and Manual therapy.   PLAN FOR NEXT SESSION: assure updated HEP and look to D/C   9:27 AM,07/06/22 Angie Humna [place] PTA [facility] [phone] [facility] [address] [place], [place], [address] Phone: [phone]   Fax:  [phone]  Patient Details  Name: Katherine Kelley MRN: [id number] Date of Birth: 02/27/2001 Referring Provider:  Sumner, Brian, MD  Encounter Date: 07/06/2022   PAYSEUR,ANGIE, PTA 07/06/2022, 9:27 AM  [facility] [address] [place], [place], [address] Phone: [phone]   Fax:  [phone] 

## 2022-07-13 ENCOUNTER — Ambulatory Visit: Payer: Medicaid Other | Admitting: Physical Therapy
